# Patient Record
Sex: Female | Born: 1960 | Race: White | Hispanic: No | Marital: Married | Smoking: Former smoker
Health system: Southern US, Community
[De-identification: ages and names within clinical notes are randomized; demographics above are authoritative.]

## PROBLEM LIST (undated history)

## (undated) DIAGNOSIS — K219 Gastro-esophageal reflux disease without esophagitis: Secondary | ICD-10-CM

## (undated) DIAGNOSIS — E785 Hyperlipidemia, unspecified: Secondary | ICD-10-CM

## (undated) DIAGNOSIS — G5600 Carpal tunnel syndrome, unspecified upper limb: Secondary | ICD-10-CM

## (undated) DIAGNOSIS — R7303 Prediabetes: Secondary | ICD-10-CM

## (undated) DIAGNOSIS — J309 Allergic rhinitis, unspecified: Secondary | ICD-10-CM

## (undated) DIAGNOSIS — D649 Anemia, unspecified: Secondary | ICD-10-CM

## (undated) DIAGNOSIS — I4891 Unspecified atrial fibrillation: Secondary | ICD-10-CM

## (undated) DIAGNOSIS — R002 Palpitations: Secondary | ICD-10-CM

## (undated) DIAGNOSIS — D72819 Decreased white blood cell count, unspecified: Principal | ICD-10-CM

## (undated) DIAGNOSIS — R5383 Other fatigue: Secondary | ICD-10-CM

## (undated) HISTORY — PX: OTHER SURGICAL HISTORY: SHX169

## (undated) HISTORY — DX: Decreased white blood cell count, unspecified: D72.819

## (undated) HISTORY — PX: WISDOM TOOTH EXTRACTION: SHX21

## (undated) HISTORY — PX: APPENDECTOMY: SHX54

## (undated) HISTORY — DX: Palpitations: R00.2

## (undated) HISTORY — DX: Hyperlipidemia, unspecified: E78.5

## (undated) HISTORY — DX: Gastro-esophageal reflux disease without esophagitis: K21.9

## (undated) HISTORY — PX: SHOULDER SURGERY: SHX246

## (undated) HISTORY — DX: Other fatigue: R53.83

## (undated) HISTORY — DX: Unspecified atrial fibrillation: I48.91

## (undated) HISTORY — DX: Prediabetes: R73.03

## (undated) HISTORY — DX: Allergic rhinitis, unspecified: J30.9

## (undated) HISTORY — DX: Carpal tunnel syndrome, unspecified upper limb: G56.00

---

## 2014-03-20 ENCOUNTER — Encounter (HOSPITAL_COMMUNITY): Payer: Self-pay | Admitting: Emergency Medicine

## 2014-03-20 ENCOUNTER — Emergency Department (HOSPITAL_COMMUNITY)
Admission: EM | Admit: 2014-03-20 | Discharge: 2014-03-20 | Disposition: A | Discharge status: Home / Self Care | Payer: Managed Care, Other (non HMO) | Attending: Emergency Medicine | Admitting: Emergency Medicine

## 2014-03-20 DIAGNOSIS — Z862 Personal history of diseases of the blood and blood-forming organs and certain disorders involving the immune mechanism: Secondary | ICD-10-CM | POA: Insufficient documentation

## 2014-03-20 DIAGNOSIS — W260XXA Contact with knife, initial encounter: Secondary | ICD-10-CM | POA: Insufficient documentation

## 2014-03-20 DIAGNOSIS — S61219A Laceration without foreign body of unspecified finger without damage to nail, initial encounter: Secondary | ICD-10-CM

## 2014-03-20 DIAGNOSIS — S61209A Unspecified open wound of unspecified finger without damage to nail, initial encounter: Secondary | ICD-10-CM | POA: Insufficient documentation

## 2014-03-20 DIAGNOSIS — W261XXA Contact with sword or dagger, initial encounter: Secondary | ICD-10-CM

## 2014-03-20 DIAGNOSIS — Y9389 Activity, other specified: Secondary | ICD-10-CM | POA: Insufficient documentation

## 2014-03-20 DIAGNOSIS — Y929 Unspecified place or not applicable: Secondary | ICD-10-CM | POA: Insufficient documentation

## 2014-03-20 HISTORY — DX: Anemia, unspecified: D64.9

## 2014-03-20 MED ORDER — TETANUS-DIPHTH-ACELL PERTUSSIS 5-2.5-18.5 LF-MCG/0.5 IM SUSP
0.5000 mL | Freq: Once | INTRAMUSCULAR | Status: AC
  Administered 2014-03-20: 0.5 mL via INTRAMUSCULAR
  Filled 2014-03-20: qty 0.5

## 2014-03-20 MED ORDER — LIDOCAINE HCL 2 % IJ SOLN
10.0000 mL | Freq: Once | INTRAMUSCULAR | Status: AC
  Administered 2014-03-20: 200 mg via INTRADERMAL
  Filled 2014-03-20: qty 20

## 2014-03-20 MED ORDER — CEPHALEXIN 500 MG PO CAPS: 500.0000 mg | ORAL_CAPSULE | Freq: Four times a day (QID) | ORAL | Status: DC

## 2014-03-20 MED ORDER — CEPHALEXIN 250 MG PO CAPS
500.0000 mg | ORAL_CAPSULE | Freq: Once | ORAL | Status: AC
  Administered 2014-03-20: 500 mg via ORAL
  Filled 2014-03-20: qty 2

## 2014-03-20 MED ORDER — MORPHINE SULFATE 4 MG/ML IJ SOLN
4.0000 mg | Freq: Once | INTRAMUSCULAR | Status: AC
  Administered 2014-03-20: 4 mg via INTRAVENOUS
  Filled 2014-03-20: qty 1

## 2014-03-20 MED ORDER — ONDANSETRON HCL 4 MG/2ML IJ SOLN
4.0000 mg | Freq: Once | INTRAMUSCULAR | Status: AC
  Administered 2014-03-20: 4 mg via INTRAVENOUS
  Filled 2014-03-20: qty 2

## 2014-03-20 NOTE — Discharge Instructions (Signed)
Keep wound dry and do not remove dressing for 24 hours if possible. After that, wash gently morning and night (every 12 hours) with soap and water. Use a topical antibiotic ointment and cover with a bandaid or gauze.  °  °Do NOT use rubbing alcohol or hydrogen peroxide, do not soak the area °  °Present to your primary care doctor or the urgent care of your choice, or the ED for suture removal in 7-10 days. °  °Every attempt was made to remove foreign body (contaminants) from the wound.  However, there is always a chance that some may remain in the wound. This can  increase your risk of infection. °  °If you see signs of infection (warmth, redness, tenderness, pus, sharp increase in pain, fever, red streaking in the skin) immediately return to the emergency department. °  °After the wound heals fully, apply sunscreen for 6-12 months to minimize scarring.  ° ° °Laceration Care, Adult °A laceration is a cut or lesion that goes through all layers of the skin and into the tissue just beneath the skin. °TREATMENT  °Some lacerations may not require closure. Some lacerations may not be able to be closed due to an increased risk of infection. It is important to see your caregiver as soon as possible after an injury to minimize the risk of infection and maximize the opportunity for successful closure. °If closure is appropriate, pain medicines may be given, if needed. The wound will be cleaned to help prevent infection. Your caregiver will use stitches (sutures), staples, wound glue (adhesive), or skin adhesive strips to repair the laceration. These tools bring the skin edges together to allow for faster healing and a better cosmetic outcome. However, all wounds will heal with a scar. Once the wound has healed, scarring can be minimized by covering the wound with sunscreen during the day for 1 full year. °HOME CARE INSTRUCTIONS  °For sutures or staples: °· Keep the wound clean and dry. °· If you were given a bandage  (dressing), you should change it at least once a day. Also, change the dressing if it becomes wet or dirty, or as directed by your caregiver. °· Wash the wound with soap and water 2 times a day. Rinse the wound off with water to remove all soap. Pat the wound dry with a clean towel. °· After cleaning, apply a thin layer of the antibiotic ointment as recommended by your caregiver. This will help prevent infection and keep the dressing from sticking. °· You may shower as usual after the first 24 hours. Do not soak the wound in water until the sutures are removed. °· Only take over-the-counter or prescription medicines for pain, discomfort, or fever as directed by your caregiver. °· Get your sutures or staples removed as directed by your caregiver. °For skin adhesive strips: °· Keep the wound clean and dry. °· Do not get the skin adhesive strips wet. You may bathe carefully, using caution to keep the wound dry. °· If the wound gets wet, pat it dry with a clean towel. °· Skin adhesive strips will fall off on their own. You may trim the strips as the wound heals. Do not remove skin adhesive strips that are still stuck to the wound. They will fall off in time. °For wound adhesive: °· You may briefly wet your wound in the shower or bath. Do not soak or scrub the wound. Do not swim. Avoid periods of heavy perspiration until the skin adhesive has fallen off   on its own. After showering or bathing, gently pat the wound dry with a clean towel.  Do not apply liquid medicine, cream medicine, or ointment medicine to your wound while the skin adhesive is in place. This may loosen the film before your wound is healed.  If a dressing is placed over the wound, be careful not to apply tape directly over the skin adhesive. This may cause the adhesive to be pulled off before the wound is healed.  Avoid prolonged exposure to sunlight or tanning lamps while the skin adhesive is in place. Exposure to ultraviolet light in the first  year will darken the scar.  The skin adhesive will usually remain in place for 5 to 10 days, then naturally fall off the skin. Do not pick at the adhesive film. You may need a tetanus shot if:  You cannot remember when you had your last tetanus shot.  You have never had a tetanus shot. If you get a tetanus shot, your arm may swell, get red, and feel warm to the touch. This is common and not a problem. If you need a tetanus shot and you choose not to have one, there is a rare chance of getting tetanus. Sickness from tetanus can be serious. SEEK MEDICAL CARE IF:   You have redness, swelling, or increasing pain in the wound.  You see a red line that goes away from the wound.  You have yellowish-white fluid (pus) coming from the wound.  You have a fever.  You notice a bad smell coming from the wound or dressing.  Your wound breaks open before or after sutures have been removed.  You notice something coming out of the wound such as wood or glass.  Your wound is on your hand or foot and you cannot move a finger or toe. SEEK IMMEDIATE MEDICAL CARE IF:   Your pain is not controlled with prescribed medicine.  You have severe swelling around the wound causing pain and numbness or a change in color in your arm, hand, leg, or foot.  Your wound splits open and starts bleeding.  You have worsening numbness, weakness, or loss of function of any joint around or beyond the wound.  You develop painful lumps near the wound or on the skin anywhere on your body. MAKE SURE YOU:   Understand these instructions.  Will watch your condition.  Will get help right away if you are not doing well or get worse. Document Released: 10/22/2005 Document Revised: 01/14/2012 Document Reviewed: 04/17/2011 [facility] Patient Information 2014 [employer], [place].

## 2014-03-20 NOTE — ED Notes (Signed)
Pt. Was cutting up cucumberrs and cut left thumb on the end with a sharp knife

## 2014-03-20 NOTE — ED Provider Notes (Signed)
CSN: [id number]     Arrival date & time 03/20/14  1105 History   First MD Initiated Contact with Patient 03/20/14 1155     Chief Complaint  Patient presents with  . Extremity Laceration     (Consider location/radiation/quality/duration/timing/severity/associated sxs/prior Treatment) HPI  Tammie Hayden is a 53 y.o. female complaining of laceration to left thumb patient was putting away a clean knife and cut herself just prior to arrival. Last tetanus shot is unknown. Bleeding is difficult to control. Reduced range of motion with no numbness or paresthesia. Patient is right-hand dominant.   Past Medical History  Diagnosis Date  . Anemia    History reviewed. No pertinent past surgical history. No family history on file. History  Substance Use Topics  . Smoking status: Not on file  . Smokeless tobacco: Never Used  . Alcohol Use: Yes   OB History   Grav Para Term Preterm Abortions TAB SAB Ect Mult Living                 Review of Systems  10 systems reviewed and found to be negative, except as noted in the HPI.  Allergies  Review of patient's allergies indicates no known allergies.  Home Medications   Prior to Admission medications   Not on File   BP 114/51  Pulse 65  Temp(Src) 98 F (36.7 C) (Oral)  Resp 17  Ht 5' 6.5" (1.689 m)  Wt 133 lb (60.328 kg)  BMI 21.15 kg/m2  SpO2 99%  LMP 03/15/2014 Physical Exam  Nursing note and vitals reviewed. Constitutional: She is oriented to person, place, and time. She appears well-developed and well-nourished. No distress.  HENT:  Head: Normocephalic and atraumatic.  Mouth/Throat: Oropharynx is clear and moist.  Eyes: Conjunctivae and EOM are normal. Pupils are equal, round, and reactive to light.  Neck: Normal range of motion.  Cardiovascular: Normal rate, regular rhythm and intact distal pulses.   Pulmonary/Chest: Effort normal and breath sounds normal. No stridor. No respiratory distress. She has no wheezes. She has no  rales. She exhibits no tenderness.  Abdominal: Soft. Bowel sounds are normal. She exhibits no distension and no mass. There is no tenderness. There is no rebound and no guarding.  Musculoskeletal: Normal range of motion.       Hands: 3 cm full-thickness laceration to the medial side of left hand thumb proximal phalanx. Patient has reduced range of motion, which is improved significantly after digital block. Neurovascularly intact.  Neurological: She is alert and oriented to person, place, and time.  Psychiatric: She has a normal mood and affect.    ED Course  Procedures (including critical care time)  LACERATION REPAIR Performed by: Wynetta EmeryNicole Wavie Hashimi Consent: Verbal consent obtained. Risks and benefits: risks, benefits and alternatives were discussed Consent given by: patient Patient identity confirmed: Wrist band   Wound explored to depth in good light on a bloodless field, with no foreign bodies seen or palpated.  Prepped and draped in normal sterile fashion    Tetanus: Tdap given  Laceration Location: Left thumb  Laceration Length: 3 cm  Anesthesia: Digital block   Local anesthetic: 2% without epinephrine  Anesthetic total: 4 ml  Irrigation method: Low Pressure  Amount of cleaning: 1L sterile NS  Skin closure: 5-0 Ethilon  Number of sutures: 9  Technique: Simple interrupted  Patient tolerance: Patient tolerated the procedure well with no immediate complications.   Antibx ointment applied. Instructions for care discussed verbally and patient provided with additional written instructions  for homecare and f/u.  Labs Review Labs Reviewed - No data to display  Imaging Review No results found.   EKG Interpretation None      MDM   Final diagnoses:  Finger laceration    Filed Vitals:   03/20/14 1123 03/20/14 1315  BP: 108/70 114/51  Pulse: 79 65  Temp: 98 F (36.7 C)   TempSrc: Oral   Resp: 17   Height: 5' 6.5" (1.689 m)   Weight: 133 lb (60.328 kg)     SpO2: 100% 99%    Medications  morphine 4 MG/ML injection 4 mg (4 mg Intravenous Given 03/20/14 1216)  ondansetron (ZOFRAN) injection 4 mg (4 mg Intravenous Given 03/20/14 1216)  Tdap (BOOSTRIX) injection 0.5 mL (0.5 mLs Intramuscular Given 03/20/14 1217)  lidocaine (XYLOCAINE) 2 % (with pres) injection 200 mg (200 mg Intradermal Given 03/20/14 1224)  cephALEXin (KEFLEX) capsule 500 mg (500 mg Oral Given 03/20/14 1319)    Tammie Hayden is a 53 y.o. female presenting with laceration to left thumb. No tendon or nerve involvement. Wound irrigated well and closed without issue, instructed on wound care, placed in splint. In  Evaluation does not show pathology that would require ongoing emergent intervention or inpatient treatment. Pt is hemodynamically stable and mentating appropriately. Discussed findings and plan with patient/guardian, who agrees with care plan. All questions answered. Return precautions discussed and outpatient follow up given.   New Prescriptions   CEPHALEXIN (KEFLEX) 500 MG CAPSULE    Take 1 capsule (500 mg total) by mouth 4 (four) times daily.    Note: Portions of this report may have been transcribed using voice recognition software. Every effort was made to ensure accuracy; however, inadvertent computerized transcription errors may be present     Wynetta Emeryicole Jolena Kittle, PA-C 03/20/14 1325

## 2014-03-20 NOTE — ED Notes (Signed)
Wound cleansed by Joni ReiningNicole, PA. Nicole suturing thumb at this time.

## 2014-03-20 NOTE — ED Provider Notes (Signed)
Medical screening examination/treatment/procedure(s) were performed by non-physician practitioner and as supervising physician I was immediately available for consultation/collaboration.   EKG Interpretation None        Jodeci Roarty N Demetres Prochnow, DO 03/20/14 1553 

## 2014-05-31 ENCOUNTER — Telehealth: Payer: Self-pay | Admitting: Hematology and Oncology

## 2014-05-31 NOTE — Telephone Encounter (Signed)
S/W PATIENT AND GAVE NP APPT FOR 08/10 @ 1:30 W/DR. GORSUCH REFERRING Tammie LucksJENNIFER BROWN, NP DX- LEUKOCYTOPENIA

## 2014-06-14 ENCOUNTER — Ambulatory Visit (HOSPITAL_BASED_OUTPATIENT_CLINIC_OR_DEPARTMENT_OTHER): Payer: Managed Care, Other (non HMO) | Admitting: Hematology and Oncology

## 2014-06-14 ENCOUNTER — Encounter: Payer: Self-pay | Admitting: Hematology and Oncology

## 2014-06-14 ENCOUNTER — Ambulatory Visit: Payer: Managed Care, Other (non HMO)

## 2014-06-14 ENCOUNTER — Telehealth: Payer: Self-pay | Admitting: Hematology and Oncology

## 2014-06-14 ENCOUNTER — Ambulatory Visit (HOSPITAL_BASED_OUTPATIENT_CLINIC_OR_DEPARTMENT_OTHER): Payer: Managed Care, Other (non HMO)

## 2014-06-14 VITALS — BP 118/49 | HR 67 | Temp 98.5°F | Resp 18 | Ht 66.0 in | Wt 134.1 lb

## 2014-06-14 DIAGNOSIS — R5382 Chronic fatigue, unspecified: Secondary | ICD-10-CM

## 2014-06-14 DIAGNOSIS — D72819 Decreased white blood cell count, unspecified: Secondary | ICD-10-CM

## 2014-06-14 DIAGNOSIS — R5381 Other malaise: Secondary | ICD-10-CM

## 2014-06-14 DIAGNOSIS — R5383 Other fatigue: Secondary | ICD-10-CM

## 2014-06-14 HISTORY — DX: Other fatigue: R53.83

## 2014-06-14 HISTORY — DX: Decreased white blood cell count, unspecified: D72.819

## 2014-06-14 LAB — CBC WITH DIFFERENTIAL/PLATELET
BASO%: 0.2 % (ref 0.0–2.0)
Basophils Absolute: 0 10*3/uL (ref 0.0–0.1)
EOS%: 0.2 % (ref 0.0–7.0)
Eosinophils Absolute: 0 10*3/uL (ref 0.0–0.5)
HCT: 37 % (ref 34.8–46.6)
HGB: 12.4 g/dL (ref 11.6–15.9)
LYMPH%: 30.3 % (ref 14.0–49.7)
MCH: 32 pg (ref 25.1–34.0)
MCHC: 33.5 g/dL (ref 31.5–36.0)
MCV: 95.6 fL (ref 79.5–101.0)
MONO#: 0.3 10*3/uL (ref 0.1–0.9)
MONO%: 8 % (ref 0.0–14.0)
NEUT#: 2.6 10*3/uL (ref 1.5–6.5)
NEUT%: 61.3 % (ref 38.4–76.8)
PLATELETS: 214 10*3/uL (ref 145–400)
RBC: 3.87 10*6/uL (ref 3.70–5.45)
RDW: 12.5 % (ref 11.2–14.5)
WBC: 4.2 10*3/uL (ref 3.9–10.3)
lymph#: 1.3 10*3/uL (ref 0.9–3.3)

## 2014-06-14 LAB — TSH: TSH: 3.404 u[IU]/mL (ref 0.350–4.500)

## 2014-06-14 NOTE — Progress Notes (Signed)
[facility] CONSULT NOTE  Patient Care Team: Boneta LucksJennifer Brown, NP as PCP - General (Nurse Practitioner)  CHIEF COMPLAINTS/PURPOSE OF CONSULTATION:  Low white blood cell count  HISTORY OF PRESENTING ILLNESS:  Tammie ClapChristine Hayden 53 y.o. female is here because of chronic low WBC.  She was found to have abnormal CBC from routine blood work. Her white blood cell count has ranged between 3.3 to 3.4 over the past year. She denies recent infection. The last prescription antibiotics was more than 3 months ago There is not reported symptoms of sinus congestion, cough, urinary frequency/urgency or dysuria, diarrhea, joint swelling/pain or abnormal skin rash.  She had no prior history or diagnosis of cancer. Her age appropriate screening programs are up-to-date. The patient has no prior diagnosis of autoimmune disease and was not prescribed corticosteroids related products. Her main complaint is fatigue. She had a lot of stress and has lost 14 pounds of weight over the past one year.  MEDICAL HISTORY:  Past Medical History  Diagnosis Date  . Anemia   . Leukopenia 06/14/2014  . Fatigue 06/14/2014    SURGICAL HISTORY: Past Surgical History  Procedure Laterality Date  . Thumb surgery Left   . Appendectomy      SOCIAL HISTORY: History   Social History  . Marital Status: Married    Spouse Name: N/A    Number of Children: N/A  . Years of Education: N/A   Occupational History  . Not on file.   Social History Main Topics  . Smoking status: Never Smoker   . Smokeless tobacco: Never Used  . Alcohol Use: Yes  . Drug Use: No  . Sexual Activity: Not on file   Other Topics Concern  . Not on file   Social History Narrative  . No narrative on file    FAMILY HISTORY: Family History  Problem Relation Age of Onset  . Cancer Maternal Grandfather     lung ca  . Cancer Paternal Grandfather     prostate ca    ALLERGIES:  has No Known Allergies.  MEDICATIONS:  Current  Outpatient Prescriptions  Medication Sig Dispense Refill  . Multiple Vitamin (MULTIVITAMIN) tablet Take 1 tablet by mouth daily.       No current facility-administered medications for this visit.    REVIEW OF SYSTEMS:   Constitutional: Denies fevers, chills or abnormal night sweats Eyes: Denies blurriness of vision, double vision or watery eyes Ears, nose, mouth, throat, and face: Denies mucositis or sore throat Respiratory: Denies cough, dyspnea or wheezes Cardiovascular: Denies palpitation, chest discomfort or lower extremity swelling Gastrointestinal:  Denies nausea, heartburn or change in bowel habits Skin: Denies abnormal skin rashes Lymphatics: Denies new lymphadenopathy or easy bruising Neurological:Denies numbness, tingling or new weaknesses Behavioral/Psych: Mood is stable, no new changes  All other systems were reviewed with the patient and are negative.  PHYSICAL EXAMINATION: ECOG PERFORMANCE STATUS: 0 - Asymptomatic  Filed Vitals:   06/14/14 1348  BP: 118/49  Pulse: 67  Temp: 98.5 F (36.9 C)  Resp: 18   Filed Weights   06/14/14 1348  Weight: 134 lb 1.6 oz (60.827 kg)    GENERAL:alert, no distress and comfortable SKIN: skin color, texture, turgor are normal, no rashes or significant lesions EYES: normal, conjunctiva are pink and non-injected, sclera clear OROPHARYNX:no exudate, no erythema and lips, buccal mucosa, and tongue normal  NECK: supple, thyroid normal size, non-tender, without nodularity LYMPH:  no palpable lymphadenopathy in the cervical, axillary or inguinal LUNGS: clear to  auscultation and percussion with normal breathing effort HEART: regular rate & rhythm and no murmurs and no lower extremity edema ABDOMEN:abdomen soft, non-tender and normal bowel sounds. No splenomegaly Musculoskeletal:no cyanosis of digits and no clubbing  PSYCH: alert & oriented x 3 with fluent speech NEURO: no focal motor/sensory deficits  LABORATORY DATA:  I have  reviewed the data as listed  ASSESSMENT & PLAN; Leukopenia The cause is unknown. The patient denies recent history of fevers, cough, chills, diarrhea or dysuria. She is asymptomatic from the leukopenia. I will observe for now.  I will order additional workup for this.   Fatigue I would order thyroid function tests for evaluation.

## 2014-06-14 NOTE — Assessment & Plan Note (Signed)
I would order thyroid function tests for evaluation.

## 2014-06-14 NOTE — Telephone Encounter (Signed)
PT SENT BACK TO LAB AND GIVEN APPT SCHEDULE FOR AUG.

## 2014-06-14 NOTE — Assessment & Plan Note (Signed)
The cause is unknown. The patient denies recent history of fevers, cough, chills, diarrhea or dysuria. She is asymptomatic from the leukopenia. I will observe for now.  I will order additional workup for this.

## 2014-06-14 NOTE — Progress Notes (Signed)
Checked in new pt with no financial concerns. °

## 2014-06-16 ENCOUNTER — Telehealth: Payer: Self-pay | Admitting: Hematology and Oncology

## 2014-06-16 NOTE — Telephone Encounter (Signed)
I reviewed the blood test results with the patient over the telephone. Total white blood cell count and CBC were completely normal. Thyroid function test was normal. I have counseled a return appointment and address all her questions.

## 2014-06-17 ENCOUNTER — Other Ambulatory Visit: Payer: Self-pay

## 2014-06-17 DIAGNOSIS — Z1231 Encounter for screening mammogram for malignant neoplasm of breast: Secondary | ICD-10-CM

## 2014-06-23 ENCOUNTER — Ambulatory Visit: Payer: Managed Care, Other (non HMO) | Admitting: Hematology and Oncology

## 2014-06-23 ENCOUNTER — Ambulatory Visit: Payer: Managed Care, Other (non HMO)

## 2014-08-31 ENCOUNTER — Encounter (HOSPITAL_COMMUNITY): Payer: Self-pay | Admitting: Emergency Medicine

## 2014-08-31 ENCOUNTER — Emergency Department (HOSPITAL_COMMUNITY): Payer: Managed Care, Other (non HMO)

## 2014-08-31 ENCOUNTER — Emergency Department (HOSPITAL_COMMUNITY)
Admission: EM | Admit: 2014-08-31 | Discharge: 2014-09-01 | Disposition: A | Discharge status: Home / Self Care | Payer: Managed Care, Other (non HMO) | Attending: Emergency Medicine | Admitting: Emergency Medicine

## 2014-08-31 DIAGNOSIS — Y9389 Activity, other specified: Secondary | ICD-10-CM | POA: Insufficient documentation

## 2014-08-31 DIAGNOSIS — Z79899 Other long term (current) drug therapy: Secondary | ICD-10-CM | POA: Insufficient documentation

## 2014-08-31 DIAGNOSIS — S68119A Complete traumatic metacarpophalangeal amputation of unspecified finger, initial encounter: Secondary | ICD-10-CM

## 2014-08-31 DIAGNOSIS — Z9889 Other specified postprocedural states: Secondary | ICD-10-CM | POA: Diagnosis not present

## 2014-08-31 DIAGNOSIS — W231XXA Caught, crushed, jammed, or pinched between stationary objects, initial encounter: Secondary | ICD-10-CM | POA: Diagnosis not present

## 2014-08-31 DIAGNOSIS — IMO0002 Reserved for concepts with insufficient information to code with codable children: Secondary | ICD-10-CM

## 2014-08-31 DIAGNOSIS — S67195A Crushing injury of left ring finger, initial encounter: Secondary | ICD-10-CM | POA: Diagnosis present

## 2014-08-31 DIAGNOSIS — Z862 Personal history of diseases of the blood and blood-forming organs and certain disorders involving the immune mechanism: Secondary | ICD-10-CM | POA: Insufficient documentation

## 2014-08-31 DIAGNOSIS — Y9289 Other specified places as the place of occurrence of the external cause: Secondary | ICD-10-CM | POA: Diagnosis not present

## 2014-08-31 DIAGNOSIS — S68625A Partial traumatic transphalangeal amputation of left ring finger, initial encounter: Secondary | ICD-10-CM | POA: Diagnosis not present

## 2014-08-31 MED ORDER — MORPHINE SULFATE 4 MG/ML IJ SOLN
4.0000 mg | Freq: Once | INTRAMUSCULAR | Status: AC
  Administered 2014-08-31: 4 mg via INTRAVENOUS
  Filled 2014-08-31: qty 1

## 2014-08-31 MED ORDER — CEPHALEXIN 500 MG PO CAPS: 500.0000 mg | ORAL_CAPSULE | Freq: Four times a day (QID) | ORAL | Status: DC

## 2014-08-31 MED ORDER — SODIUM CHLORIDE 0.9 % IV BOLUS (SEPSIS)
500.0000 mL | Freq: Once | INTRAVENOUS | Status: AC
  Administered 2014-08-31: 500 mL via INTRAVENOUS

## 2014-08-31 MED ORDER — BUPIVACAINE HCL (PF) 0.5 % IJ SOLN
10.0000 mL | Freq: Once | INTRAMUSCULAR | Status: AC
  Administered 2014-08-31: 10 mL
  Filled 2014-08-31: qty 10

## 2014-08-31 MED ORDER — PROMETHAZINE HCL 25 MG/ML IJ SOLN
12.5000 mg | Freq: Once | INTRAMUSCULAR | Status: AC
  Administered 2014-08-31: 12.5 mg via INTRAVENOUS
  Filled 2014-08-31: qty 1

## 2014-08-31 MED ORDER — TAPENTADOL HCL 50 MG PO TABS: 100.0000 mg | ORAL_TABLET | ORAL | Status: DC | PRN

## 2014-08-31 MED ORDER — BUPIVACAINE HCL 0.5 % IJ SOLN: 50.0000 mL | Freq: Once | INTRAMUSCULAR | Status: DC

## 2014-08-31 MED ORDER — BUPIVACAINE HCL 0.25 % IJ SOLN: 5.0000 mL | Freq: Once | INTRAMUSCULAR | Status: DC

## 2014-08-31 MED ORDER — ONDANSETRON HCL 4 MG/2ML IJ SOLN
4.0000 mg | Freq: Once | INTRAMUSCULAR | Status: AC
  Administered 2014-08-31: 4 mg via INTRAVENOUS
  Filled 2014-08-31: qty 2

## 2014-08-31 NOTE — Discharge Instructions (Signed)

## 2014-08-31 NOTE — Consult Note (Signed)
  See Dictation#[id number] Amanda PeaGramig MD

## 2014-08-31 NOTE — ED Notes (Signed)
Dr Gramig at bedside. 

## 2014-08-31 NOTE — ED Provider Notes (Addendum)
CSN: [id number]     Arrival date & time 08/31/14  1644 History   First MD Initiated Contact with Patient 08/31/14 1722     Chief Complaint  Patient presents with  . Finger Injury   HPI Patient presents to the emergency room with complaints of a left finger injury. A Short time ago, she was attempting to move a pig carcass with her husband  that they are going to barbecue. While doing that her finger got caught between the carcass and a tractor.  The patient lacerated and avulsed off the tip of her left ring finger. There was significant bleeding that was controlled with a pressure dressing. Patient denies any other injuries. The pain is severe. Palpation and movement increases the pain. Past Medical History  Diagnosis Date  . Anemia   . Leukopenia 06/14/2014  . Fatigue 06/14/2014   Past Surgical History  Procedure Laterality Date  . Thumb surgery Left   . Appendectomy     Family History  Problem Relation Age of Onset  . Cancer Maternal Grandfather     lung ca  . Cancer Paternal Grandfather     prostate ca   History  Substance Use Topics  . Smoking status: Never Smoker   . Smokeless tobacco: Never Used  . Alcohol Use: Yes   OB History   Grav Para Term Preterm Abortions TAB SAB Ect Mult Living                 Review of Systems  All other systems reviewed and are negative.     Allergies  Review of patient's allergies indicates no known allergies.  Home Medications   Prior to Admission medications   Medication Sig Start Date End Date Taking? Authorizing Provider  ibuprofen (ADVIL,MOTRIN) 200 MG tablet Take 400 mg by mouth every 6 (six) hours as needed for moderate pain.   Yes Historical Provider, MD  Multiple Vitamin (MULTIVITAMIN) tablet Take 1 tablet by mouth daily.   Yes Historical Provider, MD   BP 122/71  Pulse 64  Temp(Src) 98 F (36.7 C) (Oral)  Resp 16  SpO2 95%  LMP 08/17/2014 Physical Exam  Nursing note and vitals reviewed. Constitutional: She  appears well-developed and well-nourished. No distress.  HENT:  Head: Normocephalic and atraumatic.  Right Ear: External ear normal.  Left Ear: External ear normal.  Eyes: Conjunctivae are normal. Right eye exhibits no discharge. Left eye exhibits no discharge. No scleral icterus.  Neck: Neck supple. No tracheal deviation present.  Cardiovascular: Normal rate.   Pulmonary/Chest: Effort normal. No stridor. No respiratory distress.  Musculoskeletal: She exhibits no edema.  . Fingertip of the left ring finger, no active bleeding the avulsion involves the nailbed and the finger pad  Neurological: She is alert. Cranial nerve deficit: no gross deficits.  Skin: Skin is warm and dry. No rash noted.  Psychiatric: She has a normal mood and affect.    ED Course  Procedures (including critical care time) Labs Review Labs Reviewed - No data to display  Imaging Review Dg Finger Ring Left  08/31/2014   CLINICAL DATA:  Distal finger laceration  EXAM: LEFT RING FINGER 2+V  COMPARISON:  None.  FINDINGS: Amputation of the distal 4th digit with comminuted fracture involving the distal tuft/phalanx.  The joint spaces are preserved.  No radiopaque foreign body is seen.  IMPRESSION: Amputation of the distal 4th digit with comminuted fracture involving the distal tuft/phalanx.  No radiopaque foreign body is seen.   Electronically Signed  By: Charline BillsSriyesh  Krishnan M.D.   On: 08/31/2014 18:43     EKG Interpretation None          MDM   Final diagnoses:  Amputation finger, initial encounter   Pt with a partial amputation of the finger.  Will need to have her ring removed.  Discussed with Dr Amanda PeaGramig who will evaluate the patient.    Linwood DibblesJon Samaria Anes, MD 08/31/14 2241

## 2014-08-31 NOTE — ED Notes (Addendum)
Dr Gramig at bedside. 

## 2014-08-31 NOTE — ED Notes (Addendum)
Pt presents with cut on distal end of left ring finger. Bleeding controlled with pressure dressing. Finger nail bed partially removed. Pt brought tip of finger with her. PT in NAD.

## 2014-09-01 NOTE — Consult Note (Signed)
NAMDanton Hayden:  Rainwater, Zyann               ACCOUNT NO.:  [id number]  MEDICAL RECORD NO.:  [id number]  LOCATION:  B17C                         FACILITY:  [facility]  PHYSICIAN:  Dionne AnoWilliam M. Armstead Heiland, M.D.DATE OF BIRTH:  [birth year]  DATE OF CONSULTATION: DATE OF DISCHARGE:  09/01/2014                                CONSULTATION   Tammie ClapChristine Amend [facility].  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  She was seen in triage.  I have reviewed her chart at length and the findings.  I should note that she is here with her husband, they were getting ready for a  pig pickin' when an injury occurred to her thumb.  I should note she has had recent left thumb surgery.  She has also had a history of appendectomy.  PAST MEDICAL HISTORY: 1. Anemia. 2. Leukopenia. 3. Fatigue.  FAMILY HISTORY:  Cancer of lung and prostate.  SOCIAL HISTORY:  She is nonsmoker.  She occasionally enjoys an alcoholic beverage.  ALLERGIES:  She has no known drug allergies, but CODEINE does have a tendency to make her violently ill as we have experienced tonight.  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  She has nail bed laceration and disarray here.  No evidence of infection.  No evidence of dystrophic reaction or compartment syndrome. CHEST:  Clear. HEENT:  Within normal limits. ABDOMEN:  Nontender, nondistended. EXTREMITIES:  Lower extremity examination is benign.  Right upper extremity has positive IV access.  X-ray show the distal phalanx fracture.  IMPRESSION:  Amputation, left ring finger.  RECOMMENDATIONS:  I have recommended I and D, repair, reconstruction as necessary including volar advancement flap, etc.  She understands this and desires to proceed.  I have given her 12.5 mg of Phenergan IV due to continued nausea after the initial pain management.  Following this, she was very comfortable.  She was consented  verbally. Her husband was with her the entire time.  She was given a Sensorcaine block without epinephrine.  She was prepped and draped in usual sterile fashion with Betadine scrub and paint twice.  This was a 10-minute surgical Betadine scrub.  Following this, 3 L of saline were placed into the wound.  I performed I and D of skin, subcutaneous tissue, bone, nail bed, and nail plate tissue.  This was an excisional debridement with scissor, curette, and knife blade.  Following this, we then performed open treatment of the distal phalanx fracture.  I rounded this with a rongeur surgical instrument to my satisfaction.  Following this, under 4.0 loupe magnification, I performed a 6-0 chromic stellate nail bed repair without difficulty.  Following this, I then made incisions for a V-Y advancement type flap. The incisions were made sharply with knife blade.  Volar advancement flap was then placed over the bony architecture and sutured to the nail bed.  I then sculpted the medial and lateral edges.  She tolerated this well.  Adaptic was placed under the eponychial fold to prevent nail bed adherence and following this tourniquet was deflated and hemostasis was secured.  There were no complicating features.  The  patient tolerated this well.  She will be monitored and discharged home.  We are going to plan for Keflex x10 days given the open fracture. In addition to this, I am going to give her Nucynta for pain as all other pain medicines make her violently ill.  I have discussed with the family that there is an increased cause with Nucynta.  She has Phenergan at home.  She will see us in the office in 10-14 days. We will go ahead and have her see us as well as Therapy.  Should any problems arise, she will notify me.  This patient underwent, 1. I and D skin, subcutaneous tissue, tendon, bone, and associated     nail bed, nail plate tissue.  This was an excisional debridement. 2. Open  treatment, distal phalanx fracture. 3. Volar advancement flap, left ring finger secondary to amputation. 4. Nail bed repair.  It was a pleasure to see her today, we certainly wish them the best.  I look forward to seeing her back in the office in 10 days.  I want a Therapy appointment immediately following my visit.  She will ultimately need thimble and sculpting measures for the tip of her ring finger.     Dionne AnoWilliam M. Amanda PeaGramig, M.D.     [facility]  D:  08/31/2014  T:  09/01/2014  Job:  [id number]

## 2014-09-07 ENCOUNTER — Ambulatory Visit
Admission: RE | Admit: 2014-09-07 | Discharge: 2014-09-07 | Disposition: A | Discharge status: Home / Self Care | Payer: Managed Care, Other (non HMO) | Source: Ambulatory Visit

## 2014-09-07 DIAGNOSIS — Z1231 Encounter for screening mammogram for malignant neoplasm of breast: Secondary | ICD-10-CM

## 2014-09-09 ENCOUNTER — Other Ambulatory Visit: Payer: Self-pay | Admitting: Family

## 2014-09-09 DIAGNOSIS — R928 Other abnormal and inconclusive findings on diagnostic imaging of breast: Secondary | ICD-10-CM

## 2014-09-23 ENCOUNTER — Ambulatory Visit
Admission: RE | Admit: 2014-09-23 | Discharge: 2014-09-23 | Disposition: A | Discharge status: Home / Self Care | Payer: Managed Care, Other (non HMO) | Source: Ambulatory Visit | Attending: Family | Admitting: Family

## 2014-09-23 DIAGNOSIS — R928 Other abnormal and inconclusive findings on diagnostic imaging of breast: Secondary | ICD-10-CM

## 2015-09-19 ENCOUNTER — Other Ambulatory Visit: Payer: Self-pay

## 2015-09-19 DIAGNOSIS — Z1231 Encounter for screening mammogram for malignant neoplasm of breast: Secondary | ICD-10-CM

## 2015-10-07 ENCOUNTER — Ambulatory Visit: Payer: Managed Care, Other (non HMO)

## 2015-10-26 ENCOUNTER — Ambulatory Visit
Admission: RE | Admit: 2015-10-26 | Discharge: 2015-10-26 | Disposition: A | Discharge status: Home / Self Care | Payer: Managed Care, Other (non HMO) | Source: Ambulatory Visit

## 2015-10-26 DIAGNOSIS — Z1231 Encounter for screening mammogram for malignant neoplasm of breast: Secondary | ICD-10-CM

## 2016-07-04 ENCOUNTER — Other Ambulatory Visit: Payer: Self-pay

## 2016-07-04 ENCOUNTER — Other Ambulatory Visit (HOSPITAL_COMMUNITY)
Admission: RE | Admit: 2016-07-04 | Discharge: 2016-07-04 | Disposition: A | Discharge status: Home / Self Care | Payer: Managed Care, Other (non HMO) | Source: Ambulatory Visit | Attending: Family Medicine | Admitting: Family Medicine

## 2016-07-04 DIAGNOSIS — Z01419 Encounter for gynecological examination (general) (routine) without abnormal findings: Secondary | ICD-10-CM | POA: Diagnosis not present

## 2016-07-10 LAB — CYTOLOGY - PAP

## 2016-10-01 ENCOUNTER — Other Ambulatory Visit: Payer: Self-pay | Admitting: Family

## 2016-10-01 DIAGNOSIS — Z1231 Encounter for screening mammogram for malignant neoplasm of breast: Secondary | ICD-10-CM

## 2016-11-08 ENCOUNTER — Ambulatory Visit
Admission: RE | Admit: 2016-11-08 | Discharge: 2016-11-08 | Disposition: A | Discharge status: Home / Self Care | Payer: Managed Care, Other (non HMO) | Source: Ambulatory Visit | Attending: Family | Admitting: Family

## 2016-11-08 DIAGNOSIS — Z1231 Encounter for screening mammogram for malignant neoplasm of breast: Secondary | ICD-10-CM

## 2017-11-18 ENCOUNTER — Other Ambulatory Visit: Payer: Self-pay | Admitting: Physician Assistant

## 2017-11-18 DIAGNOSIS — Z1231 Encounter for screening mammogram for malignant neoplasm of breast: Secondary | ICD-10-CM

## 2017-12-10 ENCOUNTER — Ambulatory Visit: Payer: Managed Care, Other (non HMO)

## 2018-01-20 ENCOUNTER — Other Ambulatory Visit: Payer: Self-pay | Admitting: Physician Assistant

## 2018-01-20 DIAGNOSIS — Z1231 Encounter for screening mammogram for malignant neoplasm of breast: Secondary | ICD-10-CM

## 2018-02-05 ENCOUNTER — Encounter: Payer: Self-pay | Admitting: Radiology

## 2018-02-05 ENCOUNTER — Ambulatory Visit
Admission: RE | Admit: 2018-02-05 | Discharge: 2018-02-05 | Disposition: A | Discharge status: Home / Self Care | Payer: Managed Care, Other (non HMO) | Source: Ambulatory Visit | Attending: Physician Assistant | Admitting: Physician Assistant

## 2018-02-05 DIAGNOSIS — Z1231 Encounter for screening mammogram for malignant neoplasm of breast: Secondary | ICD-10-CM

## 2019-01-12 ENCOUNTER — Other Ambulatory Visit: Payer: Self-pay | Admitting: Physician Assistant

## 2019-01-12 DIAGNOSIS — Z1231 Encounter for screening mammogram for malignant neoplasm of breast: Secondary | ICD-10-CM

## 2019-02-09 ENCOUNTER — Ambulatory Visit: Payer: Managed Care, Other (non HMO)

## 2019-03-23 ENCOUNTER — Ambulatory Visit
Admission: RE | Admit: 2019-03-23 | Discharge: 2019-03-23 | Disposition: A | Discharge status: Home / Self Care | Payer: Managed Care, Other (non HMO) | Source: Ambulatory Visit | Attending: Physician Assistant | Admitting: Physician Assistant

## 2019-03-23 ENCOUNTER — Other Ambulatory Visit: Payer: Self-pay

## 2019-03-23 DIAGNOSIS — Z1231 Encounter for screening mammogram for malignant neoplasm of breast: Secondary | ICD-10-CM

## 2020-03-15 ENCOUNTER — Other Ambulatory Visit: Payer: Self-pay | Admitting: Physician Assistant

## 2020-03-15 DIAGNOSIS — Z1231 Encounter for screening mammogram for malignant neoplasm of breast: Secondary | ICD-10-CM

## 2020-03-23 ENCOUNTER — Ambulatory Visit
Admission: RE | Admit: 2020-03-23 | Discharge: 2020-03-23 | Disposition: A | Discharge status: Home / Self Care | Payer: 59 | Source: Ambulatory Visit | Attending: Physician Assistant | Admitting: Physician Assistant

## 2020-03-23 ENCOUNTER — Other Ambulatory Visit: Payer: Self-pay

## 2020-03-23 DIAGNOSIS — Z1231 Encounter for screening mammogram for malignant neoplasm of breast: Secondary | ICD-10-CM

## 2021-03-22 ENCOUNTER — Other Ambulatory Visit: Payer: Self-pay | Admitting: Physician Assistant

## 2021-03-22 DIAGNOSIS — Z1231 Encounter for screening mammogram for malignant neoplasm of breast: Secondary | ICD-10-CM

## 2021-05-18 ENCOUNTER — Other Ambulatory Visit: Payer: Self-pay

## 2021-05-18 ENCOUNTER — Ambulatory Visit
Admission: RE | Admit: 2021-05-18 | Discharge: 2021-05-18 | Disposition: A | Discharge status: Home / Self Care | Payer: 59 | Source: Ambulatory Visit | Attending: Physician Assistant | Admitting: Physician Assistant

## 2021-05-18 DIAGNOSIS — Z1231 Encounter for screening mammogram for malignant neoplasm of breast: Secondary | ICD-10-CM

## 2021-05-22 ENCOUNTER — Other Ambulatory Visit: Payer: Self-pay | Admitting: Physician Assistant

## 2021-05-22 DIAGNOSIS — R928 Other abnormal and inconclusive findings on diagnostic imaging of breast: Secondary | ICD-10-CM

## 2021-05-25 ENCOUNTER — Other Ambulatory Visit: Payer: Self-pay

## 2021-05-25 ENCOUNTER — Ambulatory Visit
Admission: RE | Admit: 2021-05-25 | Discharge: 2021-05-25 | Disposition: A | Discharge status: Home / Self Care | Payer: 59 | Source: Ambulatory Visit | Attending: Physician Assistant | Admitting: Physician Assistant

## 2021-05-25 DIAGNOSIS — R928 Other abnormal and inconclusive findings on diagnostic imaging of breast: Secondary | ICD-10-CM

## 2021-06-08 ENCOUNTER — Other Ambulatory Visit: Payer: 59

## 2022-03-11 IMAGING — MG MM DIGITAL DIAGNOSTIC UNILAT*R* W/ TOMO W/ CAD
4 series · 4 of 12 positions shown · non-contrast
Comparison: Previous exam(s).

CLINICAL DATA: Screening recall for a possible right breast mass.

EXAM:
DIGITAL DIAGNOSTIC UNILATERAL RIGHT MAMMOGRAM WITH TOMOSYNTHESIS AND
CAD; ULTRASOUND RIGHT BREAST LIMITED
TECHNIQUE: Right digital diagnostic mammography and breast tomosynthesis was
performed. The images were evaluated with computer-aided detection.;
Targeted ultrasound examination of the right breast was performed

[R CC synth-2D]
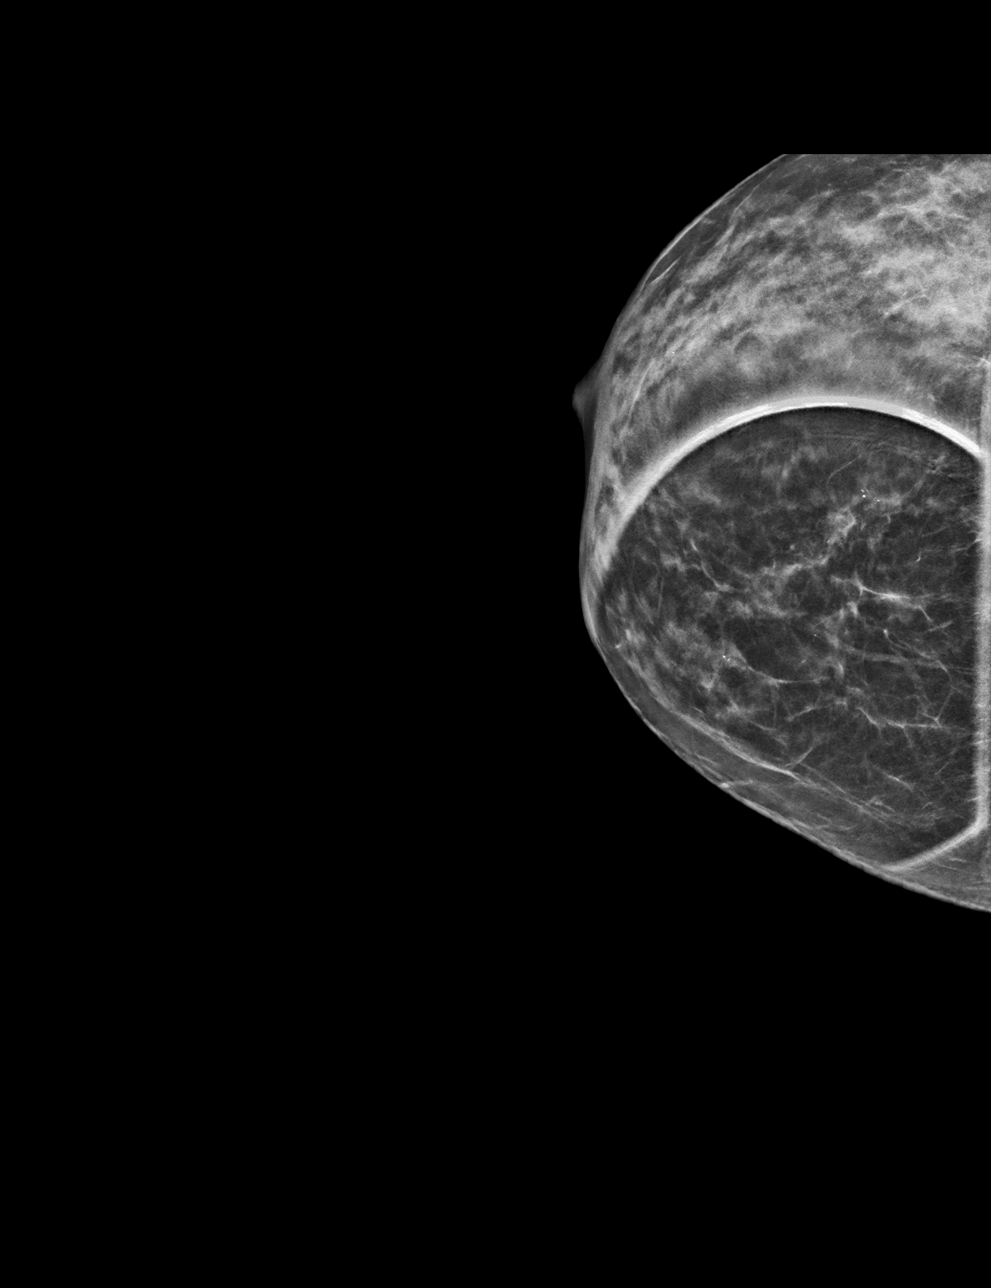

[R MLO synth-2D]
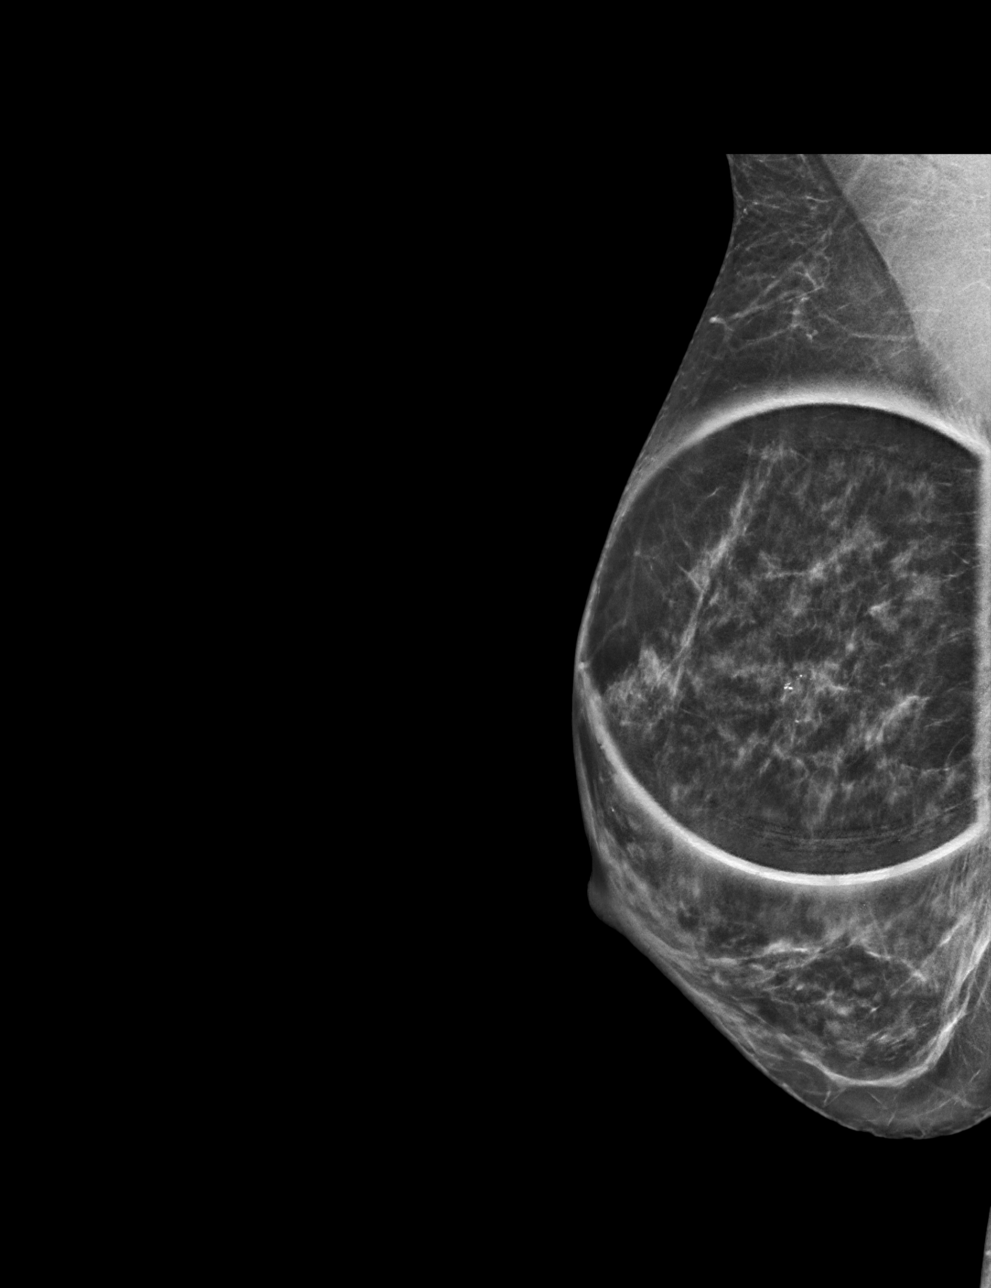

[R MLO tomo · tomo slice 27/54.0]
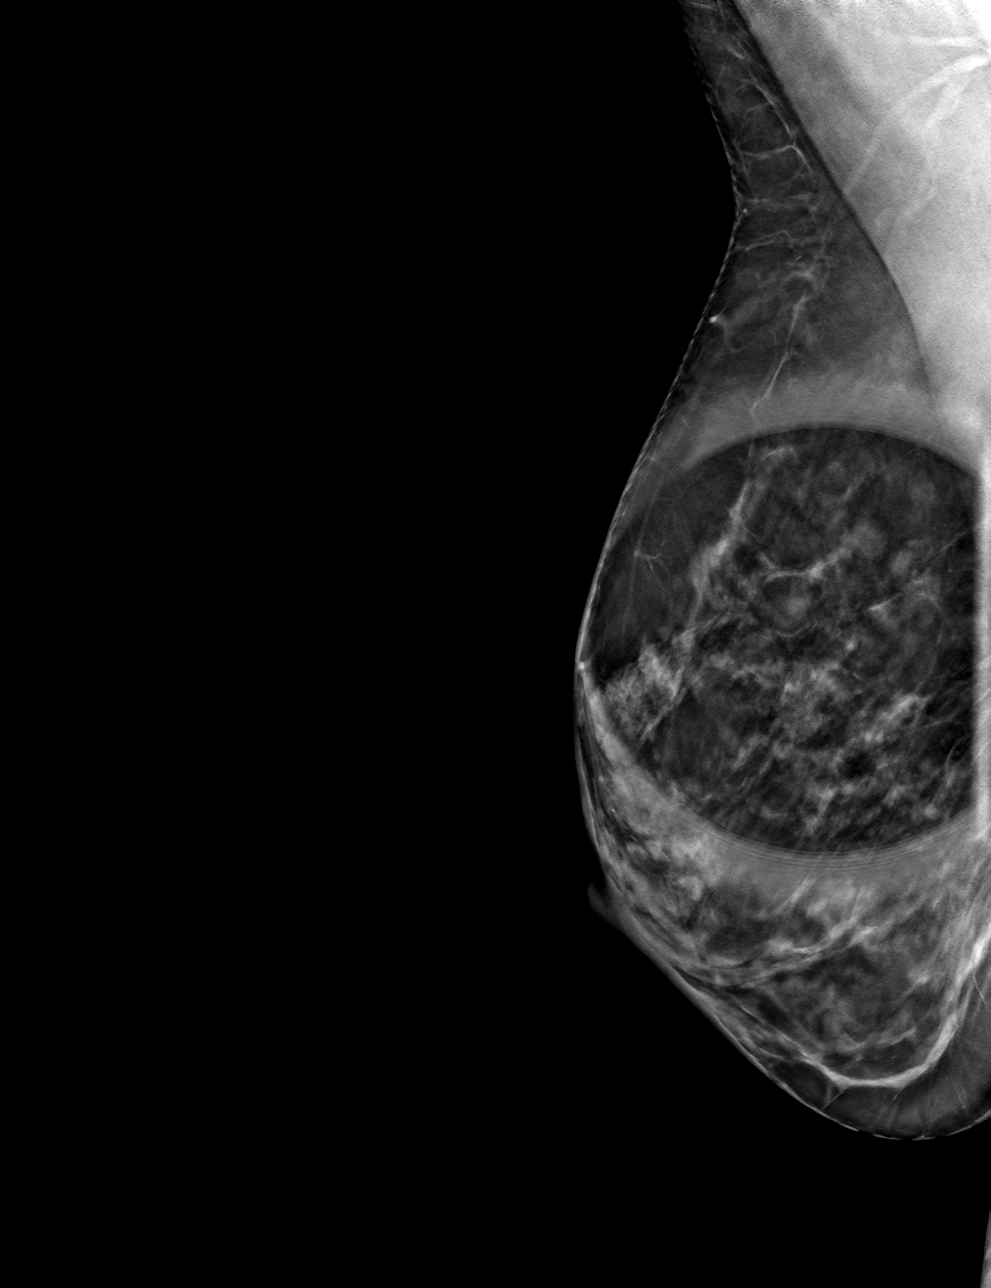

[R CC tomo · tomo slice 24/47.0]
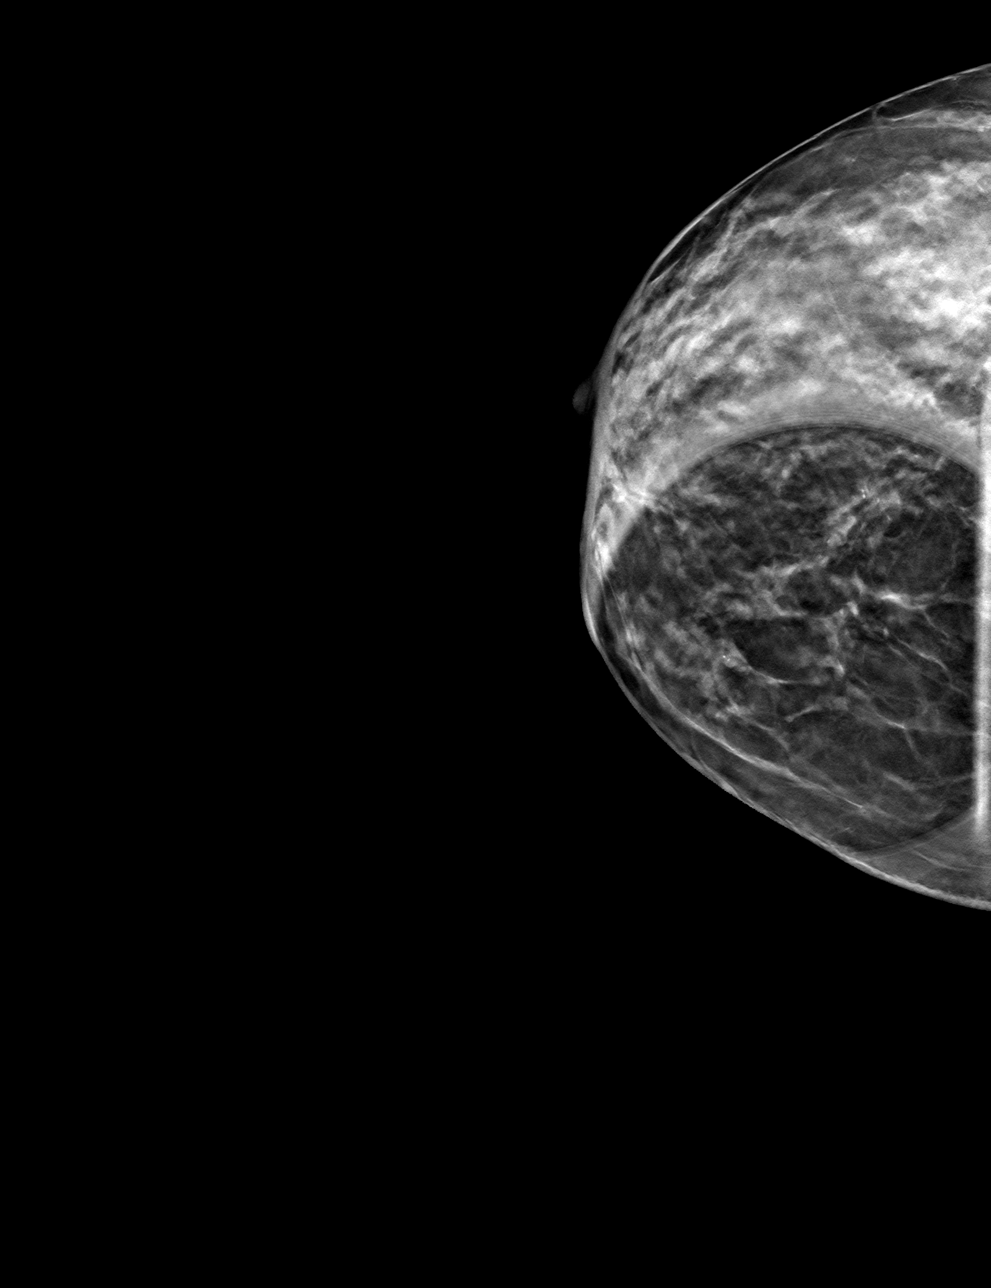

[4 of 12 positions shown; findings below may reference images not displayed]

ACR Breast Density Category c: The breast tissue is heterogeneously
dense, which may obscure small masses.
FINDINGS: Possible mass noted in the upper inner right breast on the current
screening study disperses on spot compression imaging consistent
with superimposed fibroglandular tissue. There are stable
calcifications associated with this focal area of superimposed
fibroglandular tissue. There are no defined masses or areas of
significant asymmetry. There are no suspicious calcifications and no
areas of architectural distortion.

On physical exam, no mass is palpated in the upper inner left
breast.

Targeted ultrasound is performed, showing heterogeneous
fibroglandular tissue, but no masses or suspicious lesions.
IMPRESSION: 1. No evidence of breast malignancy.
2. Stable benign right breast calcifications.

RECOMMENDATION:
Screening mammogram in one year.(Code:57-U-MBN)

I have discussed the findings and recommendations with the patient.
If applicable, a reminder letter will be sent to the patient
regarding the next appointment.

BI-RADS CATEGORY  2: Benign.

## 2022-04-19 ENCOUNTER — Other Ambulatory Visit: Payer: Self-pay | Admitting: Physician Assistant

## 2022-04-19 DIAGNOSIS — Z1231 Encounter for screening mammogram for malignant neoplasm of breast: Secondary | ICD-10-CM

## 2022-05-28 ENCOUNTER — Ambulatory Visit
Admission: RE | Admit: 2022-05-28 | Discharge: 2022-05-28 | Disposition: A | Discharge status: Home / Self Care | Payer: 59 | Source: Ambulatory Visit | Attending: Physician Assistant | Admitting: Physician Assistant

## 2022-05-28 DIAGNOSIS — Z1231 Encounter for screening mammogram for malignant neoplasm of breast: Secondary | ICD-10-CM

## 2022-09-21 ENCOUNTER — Encounter: Payer: Self-pay | Admitting: Cardiovascular Disease

## 2022-09-21 ENCOUNTER — Ambulatory Visit: Payer: 59 | Attending: Cardiovascular Disease | Admitting: Cardiovascular Disease

## 2022-09-21 VITALS — BP 108/74 | HR 68 | Ht 66.0 in | Wt 161.8 lb

## 2022-09-21 DIAGNOSIS — I493 Ventricular premature depolarization: Secondary | ICD-10-CM | POA: Diagnosis not present

## 2022-09-21 MED ORDER — METOPROLOL SUCCINATE ER 25 MG PO TB24: 25.0000 mg | ORAL_TABLET | Freq: Every day | ORAL | 3 refills | Status: DC

## 2022-09-21 NOTE — Progress Notes (Signed)
Chief Complaint  Patient presents with   New Patient (Initial Visit)    Palpitations   History of Present Illness: 61 yo female with history of PVCs, anemia, GERD and hyperlipidemia who is here today as a new consult for the evaluation of palpitations. She began to feel palpitations while she was sick with Covid in January 2023. She was seen by cardiology at [facility] in May 2023. Cardiac monitor there in April 2023 with frequent PVCs (3% burden) with 14 beat run of irregular rhythm. She was started on metoprolol 12. 5 mg po BID and at her follow up visit there reported no palpitations. Echo April 2023 at [facility] with LVEF=60-65%, no significant valve disease. Stress echo May 2023 at [facility] with no ischemic changes.   She feels well overall. No chest pain, dyspnea. Palpitations are very rare. No LE edema.   Primary Care Physician: Ladora Daniel, PA-C   Past Medical History:  Diagnosis Date   Allergic rhinitis    Anemia    Atrial fibrillation [facility])    Carpal tunnel syndrome    Fatigue 06/14/2014   GERD (gastroesophageal reflux disease)    Hyperlipidemia    Leukopenia 06/14/2014   Palpitations    Prediabetes     Past Surgical History:  Procedure Laterality Date   APPENDECTOMY     SHOULDER SURGERY     thumb surgery Left    WISDOM TOOTH EXTRACTION      Current Outpatient Medications  Medication Sig Dispense Refill   aspirin EC 81 MG tablet Take 81 mg by mouth daily. Swallow whole.     cetirizine (ZYRTEC) 10 MG chewable tablet Chew 10 mg by mouth daily.     estradiol (ESTRACE) 0.1 MG/GM vaginal cream Place 0.5 g vaginally 2 (two) times a week.     fluticasone (FLONASE) 50 MCG/ACT nasal spray Place into both nostrils daily.     ibuprofen (ADVIL) 200 MG tablet Take 200 mg by mouth every 6 (six) hours as needed.     levonorgestrel (MIRENA, 52 MG,) 20 MCG/DAY IUD 1 each by Intrauterine route once.     Magnesium 400 MG CAPS Take by mouth.     metoprolol succinate (TOPROL XL) 25 MG 24  hr tablet Take 1 tablet (25 mg total) by mouth daily. 90 tablet 3   Multiple Vitamin (MULTIVITAMIN) tablet Take 1 tablet by mouth daily.     pravastatin (PRAVACHOL) 20 MG tablet Take 20 mg by mouth daily.     No current facility-administered medications for this visit.    Allergies  Allergen Reactions   Morphine Nausea And Vomiting    Social History   Socioeconomic History   Marital status: Married    Spouse name: Not on file   Number of children: 3   Years of education: Not on file   Highest education level: Not on file  Occupational History   Occupation: Retired-Housewife  Tobacco Use   Smoking status: Former    Packs/day: 0.50    Years: 3.00    Total pack years: 1.50    Types: Cigarettes   Smokeless tobacco: Never  Substance and Sexual Activity   Alcohol use: Yes   Drug use: No   Sexual activity: Not on file  Other Topics Concern   Not on file  Social History Narrative   Not on file   Social Determinants of Health   Financial Resource Strain: Not on file  Food Insecurity: Not on file  Transportation Needs: Not on file  Physical Activity: Not on file  Stress: Not on file  Social Connections: Not on file  Intimate Partner Violence: Not on file    Family History  Problem Relation Age of Onset   Cancer Maternal Grandfather        lung ca   Cancer Paternal Grandfather        prostate ca   Breast cancer Neg Hx     Review of Systems:  As stated in the HPI and otherwise negative.   BP 108/74   Pulse 68   Ht 5\' 6"  (1.676 m)   Wt 161 lb 12.8 oz (73.4 kg)   SpO2 98%   BMI 26.12 kg/m   Physical Examination: General: Well developed, well nourished, NAD  HEENT: OP clear, mucus membranes moist  SKIN: warm, dry. No rashes. Neuro: No focal deficits  Musculoskeletal: Muscle strength 5/5 all ext  Psychiatric: Mood and affect normal  Neck: No JVD, no carotid bruits, no thyromegaly, no lymphadenopathy.  Lungs:Clear bilaterally, no wheezes, rhonci,  crackles Cardiovascular: Regular rate and rhythm. No murmurs, gallops or rubs. Abdomen:Soft. Bowel sounds present. Non-tender.  Extremities: No lower extremity edema. Pulses are 2 + in the bilateral DP/PT.  EKG:  EKG is ordered today. The ekg ordered today demonstrates NSR, Non-specific T wave abnormality.   Recent Labs: No results found for requested labs within last 365 days.   Lipid Panel No results found for: "CHOL", "TRIG", "HDL", "CHOLHDL", "VLDL", "LDLCALC", "LDLDIRECT"   Wt Readings from Last 3 Encounters:  09/21/22 161 lb 12.8 oz (73.4 kg)  06/14/14 134 lb 1.6 oz (60.8 kg)  03/20/14 133 lb (60.3 kg)    Assessment and Plan:   1. PVC/Palpitations: Rare palpitations. Continue Toprol 25 mg daily.   Labs/ tests ordered today include:   Orders Placed This Encounter  Procedures   EKG 12-Lead   Disposition:   F/U with me in one year   Signed, 03/22/14, MD, [facility] 09/21/2022 11:01 AM    [facility] [address] [place], [place], [place]  [place] Phone: [phone]; Fax: [phone]

## 2022-09-21 NOTE — Patient Instructions (Signed)
Medication Instructions:  Your physician has recommended you make the following change in your medication:  Change metoprolol succinate (Toprol) to 25 MG daily.   Lab Work: No labs.  Testing/Procedures: None.   Follow-Up: At [facility], you and your health needs are our priority.  As part of our continuing mission to provide you with exceptional heart care, we have created designated Provider Care Teams.  These Care Teams include your primary Cardiologist (physician) and Advanced Practice Providers (APPs -  Physician Assistants and Nurse Practitioners) who all work together to provide you with the care you need, when you need it.   Your next appointment:   1 year(s)  The format for your next appointment:   In Person  Provider:   Verne Carrow, MD     Important Information About Sugar

## 2023-01-31 ENCOUNTER — Other Ambulatory Visit: Payer: Self-pay | Admitting: Cardiovascular Disease

## 2023-01-31 DIAGNOSIS — R0683 Snoring: Secondary | ICD-10-CM

## 2023-01-31 DIAGNOSIS — R002 Palpitations: Secondary | ICD-10-CM

## 2023-07-03 ENCOUNTER — Other Ambulatory Visit: Payer: Self-pay | Admitting: Cardiovascular Disease

## 2023-07-03 ENCOUNTER — Telehealth: Payer: Self-pay | Admitting: Cardiovascular Disease

## 2023-07-03 MED ORDER — PRAVASTATIN SODIUM 20 MG PO TABS: 20.0000 mg | ORAL_TABLET | Freq: Every day | ORAL | 1 refills | Status: AC

## 2023-07-03 NOTE — Telephone Encounter (Signed)
Spoke with patient and discussed Dr. Gibson Ramp recommendation: We can refill Toprol 25 mg po daily and pravastatin 20 mg daily. Thanks,chris   Lopressor removed from medication list. Pravastatin Rx sent to [facility] [place] [facility] as requested.  Refill request for Toprol sent back to refills to completed.   Patient verbalized understanding and expressed appreciation for follow-up.

## 2023-07-27 ENCOUNTER — Other Ambulatory Visit: Payer: Self-pay | Admitting: Cardiovascular Disease

## 2023-07-27 DIAGNOSIS — R002 Palpitations: Secondary | ICD-10-CM

## 2023-07-27 DIAGNOSIS — R0683 Snoring: Secondary | ICD-10-CM

## 2023-09-24 ENCOUNTER — Other Ambulatory Visit: Payer: Self-pay | Admitting: Cardiovascular Disease

## 2023-10-08 ENCOUNTER — Other Ambulatory Visit: Payer: Self-pay | Admitting: Cardiovascular Disease
